# Patient Record
Sex: Female | Born: 1975
Health system: Southern US, Community
[De-identification: ages and names within clinical notes are randomized; demographics above are authoritative.]

## PROBLEM LIST (undated history)

## (undated) DIAGNOSIS — Z8489 Family history of other specified conditions: Secondary | ICD-10-CM

## (undated) DIAGNOSIS — Z8042 Family history of malignant neoplasm of prostate: Secondary | ICD-10-CM

## (undated) DIAGNOSIS — Z8041 Family history of malignant neoplasm of ovary: Secondary | ICD-10-CM

## (undated) HISTORY — DX: Family history of malignant neoplasm of prostate: Z80.42

## (undated) HISTORY — DX: Family history of other specified conditions: Z84.89

## (undated) HISTORY — DX: Family history of malignant neoplasm of ovary: Z80.41

## (undated) HISTORY — PX: LAPAROSCOPIC ABDOMINAL EXPLORATION: SHX6249

---

## 2011-02-13 ENCOUNTER — Ambulatory Visit: Payer: Worker's Compensation

## 2011-03-22 ENCOUNTER — Other Ambulatory Visit (HOSPITAL_COMMUNITY)
Admission: RE | Admit: 2011-03-22 | Discharge: 2011-03-22 | Disposition: A | Discharge status: Home / Self Care | Payer: BC Managed Care – PPO | Source: Ambulatory Visit | Attending: Obstetrics and Gynecology | Admitting: Obstetrics and Gynecology

## 2011-03-22 DIAGNOSIS — Z1159 Encounter for screening for other viral diseases: Secondary | ICD-10-CM | POA: Insufficient documentation

## 2011-03-22 DIAGNOSIS — Z124 Encounter for screening for malignant neoplasm of cervix: Secondary | ICD-10-CM | POA: Insufficient documentation

## 2013-06-08 ENCOUNTER — Other Ambulatory Visit: Payer: Self-pay | Admitting: Obstetrics and Gynecology

## 2013-06-08 ENCOUNTER — Other Ambulatory Visit (HOSPITAL_COMMUNITY)
Admission: RE | Admit: 2013-06-08 | Discharge: 2013-06-08 | Disposition: A | Discharge status: Home / Self Care | Payer: 59 | Source: Ambulatory Visit | Attending: Obstetrics and Gynecology | Admitting: Obstetrics and Gynecology

## 2013-06-08 DIAGNOSIS — Z1151 Encounter for screening for human papillomavirus (HPV): Secondary | ICD-10-CM | POA: Insufficient documentation

## 2013-06-08 DIAGNOSIS — Z01419 Encounter for gynecological examination (general) (routine) without abnormal findings: Secondary | ICD-10-CM | POA: Insufficient documentation

## 2015-10-09 DIAGNOSIS — G43019 Migraine without aura, intractable, without status migrainosus: Secondary | ICD-10-CM | POA: Diagnosis not present

## 2015-10-09 DIAGNOSIS — G43719 Chronic migraine without aura, intractable, without status migrainosus: Secondary | ICD-10-CM | POA: Diagnosis not present

## 2016-02-05 DIAGNOSIS — M545 Low back pain: Secondary | ICD-10-CM | POA: Diagnosis not present

## 2016-02-05 DIAGNOSIS — M2550 Pain in unspecified joint: Secondary | ICD-10-CM | POA: Diagnosis not present

## 2016-02-05 DIAGNOSIS — R5383 Other fatigue: Secondary | ICD-10-CM | POA: Diagnosis not present

## 2016-02-05 DIAGNOSIS — M791 Myalgia: Secondary | ICD-10-CM | POA: Diagnosis not present

## 2016-02-07 DIAGNOSIS — M545 Low back pain: Secondary | ICD-10-CM | POA: Diagnosis not present

## 2016-02-21 DIAGNOSIS — Z6822 Body mass index (BMI) 22.0-22.9, adult: Secondary | ICD-10-CM | POA: Diagnosis not present

## 2016-02-21 DIAGNOSIS — Z01419 Encounter for gynecological examination (general) (routine) without abnormal findings: Secondary | ICD-10-CM | POA: Diagnosis not present

## 2016-03-25 DIAGNOSIS — G43019 Migraine without aura, intractable, without status migrainosus: Secondary | ICD-10-CM | POA: Diagnosis not present

## 2016-03-25 DIAGNOSIS — G43719 Chronic migraine without aura, intractable, without status migrainosus: Secondary | ICD-10-CM | POA: Diagnosis not present

## 2016-06-10 DIAGNOSIS — J0100 Acute maxillary sinusitis, unspecified: Secondary | ICD-10-CM | POA: Diagnosis not present

## 2016-07-09 ENCOUNTER — Other Ambulatory Visit: Payer: Self-pay | Admitting: Obstetrics and Gynecology

## 2016-07-09 ENCOUNTER — Other Ambulatory Visit (HOSPITAL_COMMUNITY)
Admission: RE | Admit: 2016-07-09 | Discharge: 2016-07-09 | Disposition: A | Discharge status: Home / Self Care | Payer: BLUE CROSS/BLUE SHIELD | Source: Ambulatory Visit | Attending: Obstetrics and Gynecology | Admitting: Obstetrics and Gynecology

## 2016-07-09 DIAGNOSIS — Z01419 Encounter for gynecological examination (general) (routine) without abnormal findings: Secondary | ICD-10-CM | POA: Diagnosis not present

## 2016-07-09 DIAGNOSIS — Z975 Presence of (intrauterine) contraceptive device: Secondary | ICD-10-CM | POA: Diagnosis not present

## 2016-07-09 DIAGNOSIS — Z1151 Encounter for screening for human papillomavirus (HPV): Secondary | ICD-10-CM | POA: Diagnosis not present

## 2016-07-09 DIAGNOSIS — Z8742 Personal history of other diseases of the female genital tract: Secondary | ICD-10-CM | POA: Diagnosis not present

## 2016-07-11 DIAGNOSIS — Z8742 Personal history of other diseases of the female genital tract: Secondary | ICD-10-CM | POA: Diagnosis not present

## 2016-07-12 LAB — CYTOLOGY - PAP
Diagnosis: NEGATIVE
HPV: NOT DETECTED

## 2016-08-23 ENCOUNTER — Encounter: Payer: Self-pay | Admitting: Neurology

## 2016-10-14 DIAGNOSIS — L237 Allergic contact dermatitis due to plants, except food: Secondary | ICD-10-CM | POA: Diagnosis not present

## 2016-10-15 DIAGNOSIS — L255 Unspecified contact dermatitis due to plants, except food: Secondary | ICD-10-CM | POA: Diagnosis not present

## 2016-11-04 DIAGNOSIS — N750 Cyst of Bartholin's gland: Secondary | ICD-10-CM | POA: Diagnosis not present

## 2016-11-05 DIAGNOSIS — N907 Vulvar cyst: Secondary | ICD-10-CM | POA: Diagnosis not present

## 2016-12-03 ENCOUNTER — Ambulatory Visit
Admission: RE | Admit: 2016-12-03 | Discharge: 2016-12-03 | Disposition: A | Discharge status: Home / Self Care | Payer: BLUE CROSS/BLUE SHIELD | Source: Ambulatory Visit | Attending: Neurology | Admitting: Neurology

## 2016-12-03 ENCOUNTER — Other Ambulatory Visit: Payer: BLUE CROSS/BLUE SHIELD

## 2016-12-03 ENCOUNTER — Ambulatory Visit (INDEPENDENT_AMBULATORY_CARE_PROVIDER_SITE_OTHER): Payer: BLUE CROSS/BLUE SHIELD | Admitting: Neurology

## 2016-12-03 ENCOUNTER — Encounter: Payer: Self-pay | Admitting: Neurology

## 2016-12-03 VITALS — BP 90/68 | HR 108 | Ht 60.4 in | Wt 139.6 lb

## 2016-12-03 DIAGNOSIS — M25511 Pain in right shoulder: Secondary | ICD-10-CM | POA: Diagnosis not present

## 2016-12-03 DIAGNOSIS — M50223 Other cervical disc displacement at C6-C7 level: Secondary | ICD-10-CM | POA: Diagnosis not present

## 2016-12-03 DIAGNOSIS — R202 Paresthesia of skin: Secondary | ICD-10-CM | POA: Diagnosis not present

## 2016-12-03 DIAGNOSIS — G8929 Other chronic pain: Secondary | ICD-10-CM

## 2016-12-03 DIAGNOSIS — M542 Cervicalgia: Secondary | ICD-10-CM

## 2016-12-03 DIAGNOSIS — M2550 Pain in unspecified joint: Secondary | ICD-10-CM | POA: Diagnosis not present

## 2016-12-03 DIAGNOSIS — R200 Anesthesia of skin: Secondary | ICD-10-CM

## 2016-12-03 DIAGNOSIS — M25512 Pain in left shoulder: Secondary | ICD-10-CM

## 2016-12-03 NOTE — Progress Notes (Signed)
NEUROLOGY CONSULTATION NOTE  Albertina Leise MRN: [id number] DOB: 11-17-1975  Referring provider: Gerre Pebbles, PA Primary care provider: Gerre Pebbles, PA  Reason for consult:  Bilateral shoulder pain, arm numbness, back pain  HISTORY OF PRESENT ILLNESS: April Morgan is a 41 year old left handed female who presents for shoulder pain, numbness of upper extremities, back pain and generalized weakness  She has history of back and shoulder pain at least for 3 years.  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  She potentially lifts up to 20 lbs.  She feels that this repetitive activity over time has caused increased muscle and nerve pain.  She specifically notes aching/sharp pain in both shoulders.  She also notes numbness and tingling that radiates down both arms and into the hands.  She notices it when she wakes up in the morning, holds a phone or is typing on the computer.  Her arms also feel heavy.  She has difficulty lifting her arms above her head and perform activities such as blow drying her hair.  She has mild neck tightness but nothing severe.  She denies radicular pain down the arms.  She has seen orthopedics in the past.  Cortisone injections into the right shoulder briefly helped.   She also has left sided low back pain.  It is a sharp pain that does not radiate down the legs.  She denies numbness in the feet.  Legs may feel a little heavy.  Pain is aggravated by all movement.  She uses a TENS unit.  She has tried home exercises but never went to physical therapy.  She has had several imaging of the lumbar spine, including CT but never MRI.  Imaging not available, but report of complete 4+ view X-ray of lumbar spine from 02/07/16 notes "[n]o acute or chronic bony abnormality of the lumbar spine.  No significant disc space narrowing".   A couple of months ago, she started Lyrica  daily, which has been helpful.  She also takes  amitriptyline  daily for migraines.   Her mother has arthritis.    PAST MEDICAL HISTORY: History reviewed. No pertinent past medical history.  PAST SURGICAL HISTORY: Past Surgical History:  Procedure Laterality Date  . CESAREAN SECTION    . LAPAROSCOPIC ABDOMINAL EXPLORATION      MEDICATIONS: No current outpatient prescriptions on file prior to visit.   No current facility-administered medications on file prior to visit.     ALLERGIES: Allergies  Allergen Reactions  . Penicillins     FAMILY HISTORY: Family History  Problem Relation Age of Onset  . Prostate cancer Maternal Grandfather   . Prostate cancer Paternal Grandmother     SOCIAL HISTORY: Social History   Social History  . Marital status: Married    Spouse name: N/A  . Number of children: 3  . Years of education: 12   Occupational History  . shipping receiving    Social History Main Topics  . Smoking status: Never Smoker  . Smokeless tobacco: Never Used  . Alcohol use 0.6 oz/week    1 Glasses of wine per week  . Drug use: No  . Sexual activity: Not on file   Other Topics Concern  . Not on file   Social History Narrative   Married, lives in 1 story home, with husband and 3 children. They have 1 dog.    REVIEW OF SYSTEMS: Constitutional: No fevers, chills, or sweats, no generalized  fatigue, change in appetite Eyes: No visual changes, double vision, eye pain Ear, nose and throat: No hearing loss, ear pain, nasal congestion, sore throat Cardiovascular: No chest pain, palpitations Respiratory:  No shortness of breath at rest or with exertion, wheezes GastrointestinaI: No nausea, vomiting, diarrhea, abdominal pain, fecal incontinence Genitourinary:  No dysuria, urinary retention or frequency Musculoskeletal:  Bilateral shoulder pain, back pain Integumentary: No rash, pruritus, skin lesions Neurological: as above Psychiatric: No depression, insomnia, anxiety Endocrine: No palpitations,  fatigue, diaphoresis, mood swings, change in appetite, change in weight, increased thirst Hematologic/Lymphatic:  No purpura, petechiae. Allergic/Immunologic: no itchy/runny eyes, nasal congestion, recent allergic reactions, rashes  PHYSICAL EXAM: Vitals:   12/03/16 0819  BP: 90/68  Pulse: (!) 108  SpO2: 98%   General: No acute distress.  Patient appears well-groomed.  Head:  Normocephalic/atraumatic Eyes:  fundi examined but not visualized Neck: supple, no paraspinal tenderness, full range of motion Back: No paraspinal tenderness Heart: regular rate and rhythm Lungs: Clear to auscultation bilaterally. Vascular: No carotid bruits. Neurological Exam: Mental status: alert and oriented to person, place, and time, recent and remote memory intact, fund of knowledge intact, attention and concentration intact, speech fluent and not dysarthric, language intact. Cranial nerves: CN I: not tested CN II: pupils equal, round and reactive to light, visual fields intact CN III, IV, VI:  full range of motion, no nystagmus, no ptosis CN V: facial sensation intact CN VII: upper and lower face symmetric CN VIII: hearing intact CN IX, X: gag intact, uvula midline CN XI: sternocleidomastoid and trapezius muscles intact CN XII: tongue midline Bulk & Tone: normal, no fasciculations. Motor:  5/5 throughout  Sensation:  Pinprick and vibration sensation intact. Deep Tendon Reflexes:  2+ throughout, toes downgoing.  Finger to nose testing:  Without dysmetria.  Heel to shin:  Without dysmetria.  Gait:  Normal station and stride.  Able to turn and tandem walk. Romberg negative.  IMPRESSION: Bilateral upper extremity numbness Bilateral shoulder pain Low back pain  Neurologic exam is unremarkable.  Bilateral arm numbness may be carpal tunnel syndrome.  A cervical radiculopathy is possible but less likely given the bilateral symptoms.  She may have an underlying chronic pain syndrome.  PLAN: 1.  She  should continue Lyrica and titrate up as per PCP. 2.  We will check NCV-EMG of upper extremities 3.  We will check cervical spine X-rays 4.  Check ANA, Sed Rate, RF 5.  Further recommendations/follow up pending test results.  45 minutes spent face to face with patient, over 50% spent discussing diagnosis and workup.  Thank you for allowing me to take part in the care of this patient.  Shon Millet, DO  CC:  Gerre Pebbles, PA-C

## 2016-12-03 NOTE — Patient Instructions (Signed)
1.  We will check X-ray of cervical spine  2.  We will check a nerve study of the arms 3.  We will check ANA, Sed Rate, RF 4.  Further recommendations and follow up pending results.

## 2016-12-04 LAB — SEDIMENTATION RATE: Sed Rate: 2 mm/h (ref 0–20)

## 2016-12-04 LAB — ANA: Anti Nuclear Antibody(ANA): NEGATIVE

## 2016-12-04 LAB — RHEUMATOID FACTOR: Rhuematoid fact SerPl-aCnc: <14 IU/mL (ref <14)

## 2016-12-06 ENCOUNTER — Telehealth: Payer: Self-pay

## 2016-12-06 NOTE — Telephone Encounter (Signed)
LM for Pt advising of xray and lab results and that we will wait on EMG results for further testing if any

## 2016-12-06 NOTE — Telephone Encounter (Signed)
-----   Message from Drema Dallas, DO sent at 12/03/2016 10:15 AM EDT ----- X-ray shows some mild degenerative disc disease.  I want to see if the EMG shows anything and then decide if further testing is required.

## 2016-12-17 ENCOUNTER — Ambulatory Visit (INDEPENDENT_AMBULATORY_CARE_PROVIDER_SITE_OTHER): Payer: BLUE CROSS/BLUE SHIELD | Admitting: Neurology

## 2016-12-17 DIAGNOSIS — R200 Anesthesia of skin: Secondary | ICD-10-CM

## 2016-12-17 DIAGNOSIS — M25512 Pain in left shoulder: Secondary | ICD-10-CM

## 2016-12-17 DIAGNOSIS — M542 Cervicalgia: Secondary | ICD-10-CM

## 2016-12-17 DIAGNOSIS — M25511 Pain in right shoulder: Secondary | ICD-10-CM

## 2016-12-17 DIAGNOSIS — G8929 Other chronic pain: Secondary | ICD-10-CM

## 2016-12-17 NOTE — Procedures (Signed)
[facility]  [address] [place], [address]  [place], [place] [address] Tel: [phone] Fax:  [phone] Test Date:  12/17/2016  Patient: April Morgan DOB: 1975-08-07 Physician: Nita Sickle, DO  Sex: Female Height:  Ref Phys: Shon Millet, DO  ID#: [id number] Temp: 33.6C Technician:    Patient Complaints: This is a 41 year old female referred for evaluation of bilateral upper extremity numbness starting in the shoulder region radiating into the arm.  NCV & EMG Findings: Extensive electrodiagnostic testing of the right upper extremity and additional studies of the left shows:  1. Bilateral median, ulnar, and mixed palmar sensory responses are within normal limits. 2. Bilateral median and ulnar motor responses are within normal limits. 3. Chronic motor axon loss changes are isolated to the right first dorsal interosseous and triceps muscles, without accompanied active denervation.  Impression: 1. Chronic C8 radiculopathy affecting the right upper extremity, mild in degree electrically. 2. There is no evidence of carpal tunnel syndrome affecting the upper extremities.   ___________________________ Nita Sickle, DO    Nerve Conduction Studies Anti Sensory Summary Table   Site NR Peak (ms) Norm Peak (ms) P-T Amp (V) Norm P-T Amp  Left Median Anti Sensory (2nd Digit)  33.6C  Wrist    3.2 <3.4 54.4 >20  Right Median Anti Sensory (2nd Digit)  Wrist    3.1 <3.4 53.4 >20  Left Ulnar Anti Sensory (5th Digit)  33.6C  Wrist    3.0 <3.1 39.2 >12  Right Ulnar Anti Sensory (5th Digit)  33.6C  Wrist    3.0 <3.1 49.3 >12   Motor Summary Table   Site NR Onset (ms) Norm Onset (ms) O-P Amp (mV) Norm O-P Amp Site1 Site2 Delta-0 (ms) Dist (cm) Vel (m/s) Norm Vel (m/s)  Left Median Motor (Abd Poll Brev)  33.6C  Wrist    2.8 <3.9 9.4 >6 Elbow Wrist 4.9 27.0 55 >50  Elbow    7.7  8.9         Right Median Motor (Abd Poll Brev)  33.6C  Wrist    2.7 <3.9 10.4 >6 Elbow  Wrist 5.1 28.5 56 >50  Elbow    7.8  9.6         Left Ulnar Motor (Abd Dig Minimi)  33.6C  Wrist    2.7 <3.1 10.6 >7 B Elbow Wrist 3.6 23.0 64 >50  B Elbow    6.3  9.9  A Elbow B Elbow 1.7 10.0 59 >50  A Elbow    8.0  9.2         Right Ulnar Motor (Abd Dig Minimi)  33.6C  Wrist    2.3 <3.1 12.4 >7 B Elbow Wrist 3.8 23.0 61 >50  B Elbow    6.1  11.3  A Elbow B Elbow 1.9 10.0 53 >50  A Elbow    8.0  10.3          Comparison Summary Table   Site NR Peak (ms) Norm Peak (ms) P-T Amp (V) Site1 Site2 Delta-P (ms) Norm Delta (ms)  Left Median/Ulnar Palm Comparison (Wrist - 8cm)  33.6C  Median Palm    1.5 <2.2 67.3 Median Palm Ulnar Palm 0.3   Ulnar Palm    1.8 <2.2 41.6      Right Median/Ulnar Palm Comparison (Wrist - 8cm)  33.6C  Median Palm    1.8 <2.2 59.6 Median Palm Ulnar Palm 0.2   Ulnar Palm    1.6 <2.2 15.7  EMG   Side Muscle Ins Act Fibs Psw Fasc Number Recrt Dur Dur. Amp Amp. Poly Poly. Comment  Left 1stDorInt Nml Nml Nml Nml Nml Nml Nml Nml Nml Nml Nml Nml N/A  Left Ext Indicis Nml Nml Nml Nml Nml Nml Nml Nml Nml Nml Nml Nml N/A  Left PronatorTeres Nml Nml Nml Nml Nml Nml Nml Nml Nml Nml Nml Nml N/A  Left Deltoid Nml Nml Nml Nml Nml Nml Nml Nml Nml Nml Nml Nml N/A  Left Biceps Nml Nml Nml Nml Nml Nml Nml Nml Nml Nml Nml Nml N/A  Left Triceps Nml Nml Nml Nml Nml Nml Nml Nml Nml Nml Nml Nml N/A  Right Deltoid Nml Nml Nml Nml Nml Nml Nml Nml Nml Nml Nml Nml N/A  Right Ext Indicis Nml Nml Nml Nml Nml Nml Nml Nml Nml Nml Nml Nml N/A  Right PronatorTeres Nml Nml Nml Nml Nml Nml Nml Nml Nml Nml Nml Nml N/A  Right Biceps Nml Nml Nml Nml Nml Nml Nml Nml Nml Nml Nml Nml N/A  Right Triceps Nml Nml Nml Nml 1- Rapid Some 1+ Some 1+ Nml Nml N/A  Right 1stDorInt Nml Nml Nml Nml 1- Rapid Some 1+ Some 1+ Nml Nml N/A      Waveforms:

## 2016-12-18 ENCOUNTER — Telehealth: Payer: Self-pay

## 2016-12-18 DIAGNOSIS — R200 Anesthesia of skin: Secondary | ICD-10-CM

## 2016-12-18 DIAGNOSIS — R202 Paresthesia of skin: Principal | ICD-10-CM

## 2016-12-18 NOTE — Telephone Encounter (Signed)
-----   Message from Drema Dallas, DO sent at 12/18/2016 12:12 PM EDT ----- Nerve study does not show carpal tunnel syndrome but does show evidence of old irritation of a nerve in the neck.  For further evaluation of bilateral arm numbness, I would like to check MRI of cervical spine without contrast.

## 2016-12-18 NOTE — Telephone Encounter (Signed)
Called and advsd Pt of EMG results and of MRI recommendation. Pt agreed to MRI. Put in orders

## 2016-12-18 NOTE — Progress Notes (Signed)
Nerve test doesn't reveal any evidence of carpal tunnel syndrome.  It does show old findings of possible pinched nerve in the neck.  I would like to get MRI of cervical spine to evaluate further for cervical radiculopathy

## 2016-12-31 ENCOUNTER — Ambulatory Visit
Admission: RE | Admit: 2016-12-31 | Discharge: 2016-12-31 | Disposition: A | Discharge status: Home / Self Care | Payer: BLUE CROSS/BLUE SHIELD | Source: Ambulatory Visit | Attending: Neurology | Admitting: Neurology

## 2016-12-31 DIAGNOSIS — R202 Paresthesia of skin: Principal | ICD-10-CM

## 2016-12-31 DIAGNOSIS — M5023 Other cervical disc displacement, cervicothoracic region: Secondary | ICD-10-CM | POA: Diagnosis not present

## 2016-12-31 DIAGNOSIS — R200 Anesthesia of skin: Secondary | ICD-10-CM

## 2017-01-02 ENCOUNTER — Telehealth: Payer: Self-pay

## 2017-01-02 DIAGNOSIS — M62838 Other muscle spasm: Secondary | ICD-10-CM

## 2017-01-02 MED ORDER — CYCLOBENZAPRINE HCL 10 MG PO TABS: 10.0000 mg | ORAL_TABLET | Freq: Three times a day (TID) | ORAL | 0 refills | Status: DC | PRN

## 2017-01-02 NOTE — Telephone Encounter (Signed)
Called Pt. advsd her of referral to Dr Katrinka BlazingSmith and of Rx sent in. Advsd her of possible side effect

## 2017-01-02 NOTE — Telephone Encounter (Signed)
Called Pt, advsd her of wrist splint recommendations. Pt asked if could have a muscle relaxer for the tightness in across her shoulders and maybe see someone for that. She saw a Chiropractor before that didn't really help. OK to refer to Dr Terrilee FilesZach Smith?

## 2017-01-02 NOTE — Telephone Encounter (Signed)
-----   Message from Drema DallasAdam R Jaffe, DO sent at 01/01/2017  7:02 AM EDT ----- MRI of cervical spine does show a tiny disc bulge but nothing significant and unlikely cause of the numbness.  I would recommend trying wrist splints to wear at home (particularly at night in bed) to see if it helps with the bilateral hand numbness (this may help diagnosis a very mild carpal tunnel syndrome which may not be found on the nerve test).  Otherwise, I have no further recommendations at this time.  If the bilateral arm/hand numbness get worse, then we can always repeat nerve test in 6 to 12 months to look for any changes.

## 2017-01-02 NOTE — Telephone Encounter (Signed)
Ok to refer to Dr. Katrinka BlazingSmith We can prescribe her cyclobenzaprine 10mg  every 8 hours as needed for muscle spasms (no refills).  She should be cautious for drowsiness.

## 2017-01-25 NOTE — Progress Notes (Deleted)
April Morgan D.O.  Sports Medicine [address] [place], [place] [address] Phone: [phone] Subjective:    I'm seeing this patient by the request  of:  Dr. Cherylann RatelPatel DO, Jaffe DO  CC: Muscle spasms, neck pain  BJY:NWGNFAOZHYHPI:Subjective  April Ericka PontiffMontgomery is a 41 y.o. female coming in with complaint of neck pain.  Patient has had a history of this for approximately 3 years.  Feels that lifting at her work seems to be giving her some discomfort and muscle spasms.  Patient states that repetitive lifting seems to make it worse.  Patient has seen other providers for this and even had injections in the shoulder with very brief improvement.  Seems to be worse with movement.  Has used a TENS unit.  Patient was put on Lyrica by her primary care provider. Neurology did an EMG. EMG did show some chronic C8 radiculopathy affecting the right upper extremity that is mild  And had a MRI of the neck done.  This was independently visualized by me.  MRI showed patient did have a very small left paracentral protrusion at C7-T1 but otherwise unremarkable.  She states  No past medical history on file. Past Surgical History:  Procedure Laterality Date  . CESAREAN SECTION    . LAPAROSCOPIC ABDOMINAL EXPLORATION     Social History   Socioeconomic History  . Marital status: Married    Spouse name: Not on file  . Number of children: 3  . Years of education: 6012  . Highest education level: Not on file  Social Needs  . Financial resource strain: Not on file  . Food insecurity - worry: Not on file  . Food insecurity - inability: Not on file  . Transportation needs - medical: Not on file  . Transportation needs - non-medical: Not on file  Occupational History  . Occupation: shipping receiving  Tobacco Use  . Smoking status: Never Smoker  . Smokeless tobacco: Never Used  Substance and Sexual Activity  . Alcohol use: Yes    Alcohol/week: 0.6 oz    Types: 1 Glasses of wine per week  . Drug use: No  . Sexual  activity: Not on file  Other Topics Concern  . Not on file  Social History Narrative   Married, lives in 1 story home, with husband and 3 children. They have 1 dog.   Allergies  Allergen Reactions  . Penicillins    Family History  Problem Relation Age of Onset  . Prostate cancer Maternal Grandfather   . Prostate cancer Paternal Grandmother      Past medical history, social, surgical and family history all reviewed in electronic medical record.  No pertanent information unless stated regarding to the chief complaint.   Review of Systems:Review of systems updated and as accurate as of 01/25/17  No headache, visual changes, nausea, vomiting, diarrhea, constipation, dizziness, abdominal pain, skin rash, fevers, chills, night sweats, weight loss, swollen lymph nodes, body aches, joint swelling, muscle aches, chest pain, shortness of breath, mood changes.   Objective  There were no vitals taken for this visit. Systems examined below as of 01/25/17   General: No apparent distress alert and oriented x3 mood and affect normal, dressed appropriately.  HEENT: Pupils equal, extraocular movements intact  Respiratory: Patient's speak in full sentences and does not appear short of breath  Cardiovascular: No lower extremity edema, non tender, no erythema  Skin: Warm dry intact with no signs of infection or rash on extremities or on axial skeleton.  Abdomen: Soft nontender  Neuro: Cranial nerves II through XII are intact, neurovascularly intact in all extremities with 2+ DTRs and 2+ pulses.  Lymph: No lymphadenopathy of posterior or anterior cervical chain or axillae bilaterally.  Gait normal with good balance and coordination.  MSK:  Non tender with full range of motion and good stability and symmetric strength and tone of shoulders, elbows, wrist, hip, knee and ankles bilaterally.     Impression and Recommendations:     This case required medical decision making of moderate  complexity.      Note: This dictation was prepared with Dragon dictation along with smaller phrase technology. Any transcriptional errors that result from this process are unintentional.

## 2017-01-27 ENCOUNTER — Ambulatory Visit: Payer: BLUE CROSS/BLUE SHIELD | Admitting: Family Medicine

## 2017-02-10 NOTE — Progress Notes (Signed)
Tawana ScaleZach Sheneika Walstad D.O. [facility] [address] [place], [place] [address] Phone: [phone] Subjective:     CC: neck and shoulder pain   BJY:NWGNFAOZHYHPI:Subjective  April Morgan is a 41 y.o. female coming in with complaint of neck and left shoulder pain. She has been having pain for 6 years. Over the past year her pain increased. She performs repetitive movements at work with her shoulders. She works in Teaching laboratory technicianshipping and receiving. She complains of nerve and muscle pain. She also feels that she does not have strength.   She also has pain in her neck that radiates into her left arm that is intermittent. She does have numbness that goes into both hands.  Patient is concerned because even work she seems to be dropping things on a more regular basis.  Certain things such as even dressing has become more difficult.    Patient did undergo a EMG and a nerve conduction study December 17, 2016 showing a chronic C8 radiculopathy affecting the right upper extremity mild in degree severity.  Also had an MRI of the cervical spine done December 31, 2016 that was independently visualized by me.  Was found to have a very small left-sided paracentral protrusion at C7-T1 without any nerve impingement.  No past medical history on file. Past Surgical History:  Procedure Laterality Date  . CESAREAN SECTION    . LAPAROSCOPIC ABDOMINAL EXPLORATION     Social History   Socioeconomic History  . Marital status: Married    Spouse name: None  . Number of children: 3  . Years of education: 1912  . Highest education level: None  Social Needs  . Financial resource strain: None  . Food insecurity - worry: None  . Food insecurity - inability: None  . Transportation needs - medical: None  . Transportation needs - non-medical: None  Occupational History  . Occupation: shipping receiving  Tobacco Use  . Smoking status: Never Smoker  . Smokeless tobacco: Never Used  Substance and Sexual Activity  . Alcohol use: Yes      Alcohol/week: 0.6 oz    Types: 1 Glasses of wine per week  . Drug use: No  . Sexual activity: None  Other Topics Concern  . None  Social History Narrative   Married, lives in 1 story home, with husband and 3 children. They have 1 dog.   Allergies  Allergen Reactions  . Penicillins    Family History  Problem Relation Age of Onset  . Prostate cancer Maternal Grandfather   . Prostate cancer Paternal Grandmother      Past medical history, social, surgical and family history all reviewed in electronic medical record.  No pertanent information unless stated regarding to the chief complaint.   Review of Systems:Review of systems updated and as accurate as of 02/11/17  No headache, visual changes, nausea, vomiting, diarrhea, constipation, dizziness, abdominal pain, skin rash, fevers, chills, night sweats, weight loss, swollen lymph nodes, body aches, joint swelling, muscle aches, chest pain, shortness of breath, mood changes.   Objective  Blood pressure 108/86, pulse (!) 107, height 5\' 4"  (1.626 m), weight 143 lb (64.9 kg), SpO2 98 %. Systems examined below as of 02/11/17   General: No apparent distress alert and oriented x3 mood and affect normal, dressed appropriately.  HEENT: Pupils equal, extraocular movements intact  Respiratory: Patient's speak in full sentences and does not appear short of breath  Cardiovascular: No lower extremity edema, non tender, no erythema  Skin: Warm dry intact with  no signs of infection or rash on extremities or on axial skeleton.  Abdomen: Soft nontender  Neuro: Cranial nerves II through XII are intact, neurovascularly intact in all extremities with 2+ DTRs and 2+ pulses.  Lymph: No lymphadenopathy of posterior or anterior cervical chain or axillae bilaterally.  Gait normal with good balance and coordination.  MSK:  Non tender with full range of motion and good stability and symmetric strength and tone of shoulders, elbows, wrist, hip, knee and  ankles bilaterally.  Neck: Inspection mild loss of lordosis. No palpable stepoffs. Positive Spurling's maneuver laterally C8 distribution. Full neck range of motion Grip strength 4 out of 5 Strength shows weakness of the C8 distribution bilaterally Very mild numbness over the palmar aspect of the little finger bilaterally Negative Hoffman sign bilaterally Reflexes normal    Impression and Recommendations:     This case required medical decision making of moderate complexity.      Note: This dictation was prepared with Dragon dictation along with smaller phrase technology. Any transcriptional errors that result from this process are unintentional.

## 2017-02-11 ENCOUNTER — Encounter: Payer: Self-pay | Admitting: Family Medicine

## 2017-02-11 ENCOUNTER — Ambulatory Visit: Payer: BLUE CROSS/BLUE SHIELD | Admitting: Family Medicine

## 2017-02-11 VITALS — BP 108/86 | HR 107 | Ht 64.0 in | Wt 143.0 lb

## 2017-02-11 DIAGNOSIS — M5412 Radiculopathy, cervical region: Secondary | ICD-10-CM | POA: Diagnosis not present

## 2017-02-11 MED ORDER — GABAPENTIN 100 MG PO CAPS: 200.0000 mg | ORAL_CAPSULE | Freq: Every day | ORAL | 3 refills | Status: DC

## 2017-02-11 MED ORDER — VITAMIN D (ERGOCALCIFEROL) 1.25 MG (50000 UNIT) PO CAPS: 50000.0000 [IU] | ORAL_CAPSULE | ORAL | 0 refills | Status: DC

## 2017-02-11 NOTE — Patient Instructions (Addendum)
Good to see you  Ice 20 minutes 2 times daily. Usually after activity and before bed. Exercises 3 times a week.  Gabapentin 200mg  at night Once weekly vitamin D for 12 weeks  We will get an epidural in the neck and see how that does See me again 2-3 weeks after the epidural

## 2017-02-11 NOTE — Assessment & Plan Note (Signed)
Patient does have more of a C8 distribution with some weakness.  Patient is having more of a constant numbness as well.  Positive Spurling's.  Patient's MRI as well as EMG all correspond to the same area.  We discussed different treatment options and patient has elected to try the gabapentin at night as well as an epidural for diagnostic and hopefully therapeutic purposes.  Encourage patient to monitor her lifting mechanics.  Patient does not want any limitations at work at this moment.  Follow-up with me again in 2-3 weeks after the epidural to see how patient responds.

## 2017-02-25 ENCOUNTER — Other Ambulatory Visit: Payer: BLUE CROSS/BLUE SHIELD

## 2017-02-26 ENCOUNTER — Ambulatory Visit
Admission: RE | Admit: 2017-02-26 | Discharge: 2017-02-26 | Disposition: A | Discharge status: Home / Self Care | Payer: BLUE CROSS/BLUE SHIELD | Source: Ambulatory Visit | Attending: Family Medicine | Admitting: Family Medicine

## 2017-02-26 DIAGNOSIS — M5412 Radiculopathy, cervical region: Secondary | ICD-10-CM

## 2017-02-26 DIAGNOSIS — M5023 Other cervical disc displacement, cervicothoracic region: Secondary | ICD-10-CM | POA: Diagnosis not present

## 2017-02-26 MED ORDER — TRIAMCINOLONE ACETONIDE 40 MG/ML IJ SUSP (RADIOLOGY)
60.0000 mg | Freq: Once | INTRAMUSCULAR | Status: AC
  Administered 2017-02-26: 60 mg via EPIDURAL

## 2017-02-26 MED ORDER — IOPAMIDOL (ISOVUE-M 300) INJECTION 61%
1.0000 mL | Freq: Once | INTRAMUSCULAR | Status: AC | PRN
  Administered 2017-02-26: 1 mL via EPIDURAL

## 2017-02-26 NOTE — Discharge Instructions (Signed)

## 2017-03-25 ENCOUNTER — Other Ambulatory Visit: Payer: Self-pay | Admitting: Neurology

## 2017-03-25 ENCOUNTER — Ambulatory Visit: Payer: BLUE CROSS/BLUE SHIELD | Admitting: Family Medicine

## 2017-03-25 ENCOUNTER — Encounter: Payer: Self-pay | Admitting: Family Medicine

## 2017-03-25 VITALS — BP 102/80 | HR 105 | Ht 64.0 in | Wt 143.0 lb

## 2017-03-25 DIAGNOSIS — M5412 Radiculopathy, cervical region: Secondary | ICD-10-CM | POA: Diagnosis not present

## 2017-03-25 DIAGNOSIS — M999 Biomechanical lesion, unspecified: Secondary | ICD-10-CM | POA: Diagnosis not present

## 2017-03-25 NOTE — Assessment & Plan Note (Signed)
Decision today to treat with OMT was based on Physical Exam  After verbal consent patient was treated with HVLA, ME, FPR techniques in cervical, thoracic, lumbar and sacral areas  Patient tolerated the procedure well with improvement in symptoms  Patient given exercises, stretches and lifestyle modifications  See medications in patient instructions if given  Patient will follow up in 4 weeks 

## 2017-03-25 NOTE — Progress Notes (Signed)
Tawana ScaleZach Morgan D.O. [facility] [address] [place], [place] [address] Phone: [phone] Subjective:     CC: Neck pain follow-up  BJY:NWGNFAOZHYHPI:Subjective  April Ericka PontiffMontgomery is a 42 y.o. female coming in with complaint of pain.  Was found to have cervical radiculopathy.  MRI was fine with small protruding disc at C7-T1.  Given epidural.  Patient had this 2 weeks ago.  Was 100% better for 1 week.  Still about 60-70% better.  Not able to notice increasing strength recently.  Patient denies any numbness at this point.  Everything seems to be intermittent.  Taking gabapentin and Lyrica at night.     No past medical history on file. Past Surgical History:  Procedure Laterality Date  . CESAREAN SECTION    . LAPAROSCOPIC ABDOMINAL EXPLORATION     Social History   Socioeconomic History  . Marital status: Married    Spouse name: None  . Number of children: 3  . Years of education: 5212  . Highest education level: None  Social Needs  . Financial resource strain: None  . Food insecurity - worry: None  . Food insecurity - inability: None  . Transportation needs - medical: None  . Transportation needs - non-medical: None  Occupational History  . Occupation: shipping receiving  Tobacco Use  . Smoking status: Never Smoker  . Smokeless tobacco: Never Used  Substance and Sexual Activity  . Alcohol use: Yes    Alcohol/week: 0.6 oz    Types: 1 Glasses of wine per week  . Drug use: No  . Sexual activity: None  Other Topics Concern  . None  Social History Narrative   Married, lives in 1 story home, with husband and 3 children. They have 1 dog.   Allergies  Allergen Reactions  . Penicillins    Family History  Problem Relation Age of Onset  . Prostate cancer Maternal Grandfather   . Prostate cancer Paternal Grandmother      Past medical history, social, surgical and family history all reviewed in electronic medical record.  No pertanent information unless stated regarding to the  chief complaint.   Review of Systems:Review of systems updated and as accurate as of 03/25/17  No headache, visual changes, nausea, vomiting, diarrhea, constipation, dizziness, abdominal pain, skin rash, fevers, chills, night sweats, weight loss, swollen lymph nodes, body aches, joint swelling, chest pain, shortness of breath, mood changes.  Positive muscle aches  Objective  Blood pressure 102/80, pulse (!) 105, height 5\' 4"  (1.626 m), weight 143 lb (64.9 kg), SpO2 98 %. Systems examined below as of 03/25/17   General: No apparent distress alert and oriented x3 mood and affect normal, dressed appropriately.  HEENT: Pupils equal, extraocular movements intact  Respiratory: Patient's speak in full sentences and does not appear short of breath  Cardiovascular: No lower extremity edema, non tender, no erythema  Skin: Warm dry intact with no signs of infection or rash on extremities or on axial skeleton.  Abdomen: Soft nontender  Neuro: Cranial nerves II through XII are intact, neurovascularly intact in all extremities with 2+ DTRs and 2+ pulses.  Lymph: No lymphadenopathy of posterior or anterior cervical chain or axillae bilaterally.  Gait normal with good balance and coordination.  MSK:  Non tender with full range of motion and good stability and symmetric strength and tone of shoulders, elbows, wrist, hip, knee and ankles bilaterally.  Neck neck: Inspection mild loss of lordosis. No palpable stepoffs. Negative Spurling's maneuver. Of sidebending bilaterally Grip strength  and sensation normal in bilateral hands Strength good C4 to T1 distribution No sensory change to C4 to T1 Negative Hoffman sign bilaterally Reflexes normal   Osteopathic findings C2 flexed rotated and side bent right C4 flexed rotated and side bent left C7 flexed rotated and side bent left T9 extended rotated and side bent left L3 flexed rotated and side bent right Sacrum right on right     Impression and  Recommendations:     This case required medical decision making of moderate complexity.      Note: This dictation was prepared with Dragon dictation along with smaller phrase technology. Any transcriptional errors that result from this process are unintentional.

## 2017-03-25 NOTE — Progress Notes (Signed)
Tawana ScaleZach Smith D.O. [facility] [address] [place], [place] [address] Phone: [phone](336) [phone] Subjective:    I'm seeing this patient by the request  of:    CC:   BJY:NWGNFAOZHYHPI:Subjective  April Morgan is a 42 y.o. female coming in for follow up for epidural injection. She has had some relief but is still having some pain. Her pain is present with activities that engage her shoulders overhead. She has been lifting repetitively at work.   Onset-  Location Duration-  Character- Aggravating factors- Reliving factors-  Therapies tried-  Severity-     No past medical history on file. Past Surgical History:  Procedure Laterality Date  . CESAREAN SECTION    . LAPAROSCOPIC ABDOMINAL EXPLORATION     Social History   Socioeconomic History  . Marital status: Married    Spouse name: Not on file  . Number of children: 3  . Years of education: 4812  . Highest education level: Not on file  Social Needs  . Financial resource strain: Not on file  . Food insecurity - worry: Not on file  . Food insecurity - inability: Not on file  . Transportation needs - medical: Not on file  . Transportation needs - non-medical: Not on file  Occupational History  . Occupation: shipping receiving  Tobacco Use  . Smoking status: Never Smoker  . Smokeless tobacco: Never Used  Substance and Sexual Activity  . Alcohol use: Yes    Alcohol/week: 0.6 oz    Types: 1 Glasses of wine per week  . Drug use: No  . Sexual activity: Not on file  Other Topics Concern  . Not on file  Social History Narrative   Married, lives in 1 story home, with husband and 3 children. They have 1 dog.   Allergies  Allergen Reactions  . Penicillins    Family History  Problem Relation Age of Onset  . Prostate cancer Maternal Grandfather   . Prostate cancer Paternal Grandmother      Past medical history, social, surgical and family history all reviewed in electronic medical record.  No pertanent information unless  stated regarding to the chief complaint.   Review of Systems:Review of systems updated and as accurate as of 03/25/17  No headache, visual changes, nausea, vomiting, diarrhea, constipation, dizziness, abdominal pain, skin rash, fevers, chills, night sweats, weight loss, swollen lymph nodes, body aches, joint swelling, muscle aches, chest pain, shortness of breath, mood changes.   Objective  There were no vitals taken for this visit. Systems examined below as of 03/25/17   General: No apparent distress alert and oriented x3 mood and affect normal, dressed appropriately.  HEENT: Pupils equal, extraocular movements intact  Respiratory: Patient's speak in full sentences and does not appear short of breath  Cardiovascular: No lower extremity edema, non tender, no erythema  Skin: Warm dry intact with no signs of infection or rash on extremities or on axial skeleton.  Abdomen: Soft nontender  Neuro: Cranial nerves II through XII are intact, neurovascularly intact in all extremities with 2+ DTRs and 2+ pulses.  Lymph: No lymphadenopathy of posterior or anterior cervical chain or axillae bilaterally.  Gait normal with good balance and coordination.  MSK:  Non tender with full range of motion and good stability and symmetric strength and tone of shoulders, elbows, wrist, hip, knee and ankles bilaterally.     Impression and Recommendations:     This case required medical decision making of moderate complexity.  Note: This dictation was prepared with Dragon dictation along with smaller phrase technology. Any transcriptional errors that result from this process are unintentional.

## 2017-03-25 NOTE — Patient Instructions (Signed)
Good to see you  We will order one more injection  Only gabapentin at night  Stop the lyrica See me again in 4-6 weeks for manipulation

## 2017-03-25 NOTE — Assessment & Plan Note (Signed)
Significant improvement after the epidural.  Started osteopathic manipulation.  Encouraged home exercises.  Discontinue the Lyrica.  We discussed the possibility of Effexor or possibly repeating the injection.  Patient will consider these.  Follow-up again in 4-6 weeks

## 2017-04-10 DIAGNOSIS — G43019 Migraine without aura, intractable, without status migrainosus: Secondary | ICD-10-CM | POA: Diagnosis not present

## 2017-04-10 DIAGNOSIS — G43719 Chronic migraine without aura, intractable, without status migrainosus: Secondary | ICD-10-CM | POA: Diagnosis not present

## 2017-04-25 ENCOUNTER — Ambulatory Visit
Admission: RE | Admit: 2017-04-25 | Discharge: 2017-04-25 | Disposition: A | Discharge status: Home / Self Care | Payer: BLUE CROSS/BLUE SHIELD | Source: Ambulatory Visit | Attending: Family Medicine | Admitting: Family Medicine

## 2017-04-25 DIAGNOSIS — M5412 Radiculopathy, cervical region: Secondary | ICD-10-CM | POA: Diagnosis not present

## 2017-04-25 MED ORDER — IOPAMIDOL (ISOVUE-M 300) INJECTION 61%
1.0000 mL | Freq: Once | INTRAMUSCULAR | Status: AC | PRN
  Administered 2017-04-25: 1 mL via EPIDURAL

## 2017-04-25 MED ORDER — TRIAMCINOLONE ACETONIDE 40 MG/ML IJ SUSP (RADIOLOGY)
60.0000 mg | Freq: Once | INTRAMUSCULAR | Status: AC
  Administered 2017-04-25: 60 mg via EPIDURAL

## 2017-05-01 ENCOUNTER — Other Ambulatory Visit: Payer: Self-pay | Admitting: Family Medicine

## 2017-06-12 DIAGNOSIS — Z23 Encounter for immunization: Secondary | ICD-10-CM | POA: Diagnosis not present

## 2017-07-16 ENCOUNTER — Other Ambulatory Visit: Payer: Self-pay | Admitting: Family Medicine

## 2017-07-16 NOTE — Telephone Encounter (Signed)
Refill done.  

## 2017-08-01 ENCOUNTER — Other Ambulatory Visit: Payer: Self-pay | Admitting: Neurology

## 2017-08-14 DIAGNOSIS — Z01411 Encounter for gynecological examination (general) (routine) with abnormal findings: Secondary | ICD-10-CM | POA: Diagnosis not present

## 2017-08-20 ENCOUNTER — Encounter: Payer: Self-pay | Admitting: Genetics

## 2017-08-20 ENCOUNTER — Telehealth: Payer: Self-pay | Admitting: Genetics

## 2017-08-20 NOTE — Telephone Encounter (Signed)
A genetic counseling appt has been scheduled for the pt to see Darral DashLindsay Smith on 7/22 at 4pm. Pt aware to arrive 30 minutes early. Letter mailed.

## 2017-08-27 DIAGNOSIS — N83202 Unspecified ovarian cyst, left side: Secondary | ICD-10-CM | POA: Diagnosis not present

## 2017-09-22 DIAGNOSIS — G43019 Migraine without aura, intractable, without status migrainosus: Secondary | ICD-10-CM | POA: Diagnosis not present

## 2017-09-22 DIAGNOSIS — G43719 Chronic migraine without aura, intractable, without status migrainosus: Secondary | ICD-10-CM | POA: Diagnosis not present

## 2017-09-29 ENCOUNTER — Inpatient Hospital Stay: Payer: BLUE CROSS/BLUE SHIELD | Attending: Genetic Counselor | Admitting: Genetics

## 2017-09-29 DIAGNOSIS — Z8042 Family history of malignant neoplasm of prostate: Secondary | ICD-10-CM

## 2017-09-29 DIAGNOSIS — Z8041 Family history of malignant neoplasm of ovary: Secondary | ICD-10-CM | POA: Diagnosis not present

## 2017-09-29 DIAGNOSIS — Z1379 Encounter for other screening for genetic and chromosomal anomalies: Secondary | ICD-10-CM

## 2017-09-29 DIAGNOSIS — Z8489 Family history of other specified conditions: Secondary | ICD-10-CM

## 2017-09-29 DIAGNOSIS — Z8481 Family history of carrier of genetic disease: Secondary | ICD-10-CM | POA: Diagnosis not present

## 2017-09-30 ENCOUNTER — Encounter: Payer: Self-pay | Admitting: Genetics

## 2017-09-30 DIAGNOSIS — Z8041 Family history of malignant neoplasm of ovary: Secondary | ICD-10-CM | POA: Insufficient documentation

## 2017-09-30 DIAGNOSIS — Z8489 Family history of other specified conditions: Secondary | ICD-10-CM | POA: Insufficient documentation

## 2017-09-30 DIAGNOSIS — Z8042 Family history of malignant neoplasm of prostate: Secondary | ICD-10-CM | POA: Insufficient documentation

## 2017-09-30 NOTE — Progress Notes (Signed)
REFERRING PROVIDER: Thurnell Lose, MD [address] [place], [place] [address]  PRIMARY PROVIDER:  Adron Bene, PA-C  PRIMARY REASON FOR VISIT:  1. Family history of prostate cancer   2. Family history of ovarian cancer   3. Family history of genetic disease     HISTORY OF PRESENT ILLNESS:   [name], a 42 y.o. female, was seen for a [facility] cancer genetics consultation at the request of Dr. Simona Huh due to a family history of cancer.  April Morgan presents to clinic today to discuss the possibility of a hereditary predisposition to cancer, genetic testing, and to further clarify her future cancer risks, as well as potential cancer risks for family members.   April Morgan is a 42 y.o. female with no personal history of cancer.     HORMONAL RISK FACTORS:  Menarche was at age 39.  First live birth at age 48.  OCP use for approximately 2 years.  Ovaries intact: yes.  Hysterectomy: no.  Menopausal status: premenopausal.  HRT use: 0 years. Colonoscopy: no; not examined. Mammogram within the last year: no. Has missed 1 year Number of breast biopsies: 0.   Past Medical History:  Diagnosis Date  . Family history of genetic disease   . Family history of ovarian cancer   . Family history of prostate cancer     Past Surgical History:  Procedure Laterality Date  . CESAREAN SECTION    . LAPAROSCOPIC ABDOMINAL EXPLORATION      Social History   Socioeconomic History  . Marital status: Married    Spouse name: Not on file  . Number of children: 3  . Years of education: 52  . Highest education level: Not on file  Occupational History  . Occupation: shipping receiving  Social Needs  . Financial resource strain: Not on file  . Food insecurity:    Worry: Not on file    Inability: Not on file  . Transportation needs:    Medical: Not on file    Non-medical: Not on file  Tobacco Use  . Smoking status: Never Smoker  . Smokeless tobacco: Never Used   Substance and Sexual Activity  . Alcohol use: Yes    Alcohol/week: 0.6 oz    Types: 1 Glasses of wine per week  . Drug use: No  . Sexual activity: Not on file  Lifestyle  . Physical activity:    Days per week: Not on file    Minutes per session: Not on file  . Stress: Not on file  Relationships  . Social connections:    Talks on phone: Not on file    Gets together: Not on file    Attends religious service: Not on file    Active member of club or organization: Not on file    Attends meetings of clubs or organizations: Not on file    Relationship status: Not on file  Other Topics Concern  . Not on file  Social History Narrative   Married, lives in 1 story home, with husband and 3 children. They have 1 dog.     FAMILY HISTORY:  We obtained a detailed, 4-generation family history.  Significant diagnoses are listed below: Family History  Problem Relation Age of Onset  . Prostate cancer Maternal Grandfather        metastatic, 80's  . Prostate cancer Paternal Grandmother        metastatic, 14's  . Ovarian cancer Paternal Aunt 95  . Skin cancer Paternal  Aunt   . Other Cousin        Positive genetic mutation- unk which mutation? report not available   April Morgan has 2 sons and 1 daughter ages 23, 55, and 70 with no history of cancer. April Morgan has 4 sisters and 1 brother.    April Morgan's father: 55, no history of cancer. He has declined genetic testing.  Paternal Aunts/Uncles: 2 paternal aunts and 1 paternal uncle.  1 paternal aunt was dx with ovarian cancer at 31 and died in her 43's.  She also had skin cancer.  Paternal cousins: no history of cancer.  The daughter of the aunt with ovarian cancer had genetic testing that revealed a genetic mutation.  April Morgan does not know the mutation and has not been able to get her cousin to resend her this information. She remembers it being assocaited with colon, breast, and skin cancer?? Paternal grandfather: died of  metastatic prostate cancer in his 40's Paternal grandmother:no history of cancer.  Her siblings/relatives had some history of breast cancer, exact dx and relations unk.   [name]'s mother: 19, no history of cancer.  Maternal Aunts/Uncles: several half maternal aunts/uncles, no info known  Maternal cousins: no info Maternal grandfather: died of metastatic prostate cancer in his 53's Maternal grandmother:no history of cancer.   Patient's maternal ancestors are of [place] descent, and paternal ancestors are of Scottish/Irish/Native American descent. There is no reported Ashkenazi Jewish ancestry. There is no known consanguinity.  GENETIC COUNSELING ASSESSMENT: [name] is a 42 y.o. female with a family history of a genetic mutation associated with increased cancer risk. We, therefore, discussed and recommended the following at today's visit.   DISCUSSION: We reviewed the characteristics, features and inheritance patterns of hereditary cancer syndromes. We also discussed genetic testing, including the appropriate family members to test, the process of testing, insurance coverage and turn-around-time for results. We discussed the implications of a negative, positive and/or variant of uncertain significant result. Because the report and name of the gene her cousin tested positive for is unavailable , we recommended a large panel to ensure we test whatever gene mutation was identified in her cousin.  We recommended April Morgan pursue genetic testing for the Multi-Cancer gene panel.   The Multi-Cancer Panel offered by [facility] includes sequencing and/or deletion duplication testing of the following 84 genes: AIP, ALK, APC, ATM, AXIN2,BAP1,  BARD1, BLM, BMPR1A, BRCA1, BRCA2, BRIP1, CASR, CDC73, CDH1, CDK4, CDKN1B, CDKN1C, CDKN2A (p14ARF), CDKN2A (p16INK4a), CEBPA, CHEK2, CTNNA1, DICER1, DIS3L2, EGFR (c.2369C>T, p.Thr790Met variant only), EPCAM (Deletion/duplication testing only), FH, FLCN, GATA2,  GPC3, GREM1 (Promoter region deletion/duplication testing only), HOXB13 (c.251G>A, p.Gly84Glu), HRAS, KIT, MAX, MEN1, MET, MITF (c.952G>A, p.Glu318Lys variant only), MLH1, MSH2, MSH3, MSH6, MUTYH, NBN, NF1, NF2, NTHL1, PALB2, PDGFRA, PHOX2B, PMS2, POLD1, POLE, POT1, PRKAR1A, PTCH1, PTEN, RAD50, RAD51C, RAD51D, RB1, RECQL4, RET, RUNX1, SDHAF2, SDHA (sequence changes only), SDHB, SDHC, SDHD, SMAD4, SMARCA4, SMARCB1, SMARCE1, STK11, SUFU, TERC, TERT, TMEM127, TP53, TSC1, TSC2, VHL, WRN and WT1.   We discussed that only 5-10% of cancers are associated with a Hereditary Cancer Predisposition Syndrome.  The most common hereditary cancer syndrome associated with colon cancer is Lynch Syndrome.  Given that she said the highest risk for the family mutation she recalls was colon cancer we discussed this condition.    Lynch Syndrome is caused by mutations in the genes: MLH1, MSH2, MSH6, PMS2 and EPCAM.  This syndrome increases the risk for colon, uterine, ovarian and stomach cancers, as well as  others.  Families with Lynch Syndrome tend to have multiple family members with these cancers, typically diagnosed under age 44, and diagnoses in multiple generations.    We discussed that there are several other genes that are associated with an increased risk for colon cancer and increased polyp burden (MUTYH, APC, POLE, CHEK2, etc.) We also dicussed that there are many genes that cause many different types of cancer risks.    We discussed that if she is found to have a mutation in one of these genes, it may impact future medical management recommendations such as increased cancer screenings and consideration of risk reducing surgeries.  A positive result could also have implications for the patient's family members.  A Negative result would likely mean we she did not inherit the familial mutation.  However, we cannot confirm this without a report from her cousin to confirm.  However, given we are ordering a large panel, it  can be reasonably assumed if her result is negative, it is unlikely she has the family mutation.   However, we did discuss genetic testing is not perfect and cannot definitively rule out a hereditary predisposition to cancer.  There could be mutations that are undetectable by current technology, or in genes not yet tested or identified to increase cancer risk.    We discussed the potential to find a Variant of Uncertain Significance or VUS.  These are variants that have not yet been identified as pathogenic or benign, and it is unknown if this variant is associated with increased cancer risk or if this is a normal finding.  Most VUS's are reclassified to benign or likely benign.   It should not be used to make medical management decisions. With time, we suspect the lab will determine the significance of any VUS's identified if any.   Based on Ms. Bulson's family history of cancer, she meets medical criteria for genetic testing. Despite that she meets criteria, she may still have an out of pocket cost. The laboratory can provide her with an estimate of her OOP cost.   PLAN: After considering the risks, benefits, and limitations, Ms. Kuper  provided informed consent to pursue genetic testing and the blood sample was sent to [facility] for analysis of the Multi-Cancer panel. Results should be available within approximately 2-3 weeks' time, at which point they will be disclosed by telephone to Ms. Kernes, as will any additional recommendations warranted by these results. Ms. Nath will receive a summary of her genetic counseling visit and a copy of her results once available. This information will also be available in Epic. We encouraged Ms. Kauer to remain in contact with cancer genetics annually so that we can continuously update the family history and inform her of any changes in cancer genetics and testing that may be of benefit for her family. Ms. Mozley's questions were  answered to her satisfaction today. Our contact information was provided should additional questions or concerns arise.  Based on Ms. Albarracin's family history, we recommended her maternal and paternal relatives also have genetic counseling and testing. Ms. Ricardo will let us know if we can be of any assistance in coordinating genetic counseling and/or testing for this family member.   Lastly, we encouraged Ms. Heidecker to remain in contact with cancer genetics annually so that we can continuously update the family history and inform her of any changes in cancer genetics and testing that may be of benefit for this family.   Ms.  Hinton's questions were answered  to her satisfaction today. Our contact information was provided should additional questions or concerns arise. Thank you for the referral and allowing [place] to share in the care of your patient.   Tana Felts, MS, [facility] Genetic Counselor [email or web address] phone: [phone]  The patient was seen for a total of 35 minutes in face-to-face genetic counseling. This patient was discussed with Dr.'s Magrinat, Dr. Lindi Adie, or Dr. Burr Medico who agrees with the above.

## 2017-10-13 ENCOUNTER — Telehealth: Payer: Self-pay | Admitting: Genetics

## 2017-10-15 ENCOUNTER — Encounter: Payer: Self-pay | Admitting: Genetics

## 2017-10-15 ENCOUNTER — Ambulatory Visit: Payer: Self-pay | Admitting: Genetics

## 2017-10-15 DIAGNOSIS — Z8041 Family history of malignant neoplasm of ovary: Secondary | ICD-10-CM

## 2017-10-15 DIAGNOSIS — Z8489 Family history of other specified conditions: Secondary | ICD-10-CM

## 2017-10-15 DIAGNOSIS — Z1379 Encounter for other screening for genetic and chromosomal anomalies: Secondary | ICD-10-CM

## 2017-10-15 DIAGNOSIS — Z8042 Family history of malignant neoplasm of prostate: Secondary | ICD-10-CM

## 2017-10-15 NOTE — Telephone Encounter (Addendum)
Revealed negative genetic testing.  Revealed VUS in TERT.  This normal result is reassuring and indicates that it is unlikely [name]'s cancer is due to a hereditary cause.  It is unlikely that there is an increased risk of another cancer due to a mutation in one of these genes.  However, genetic testing is not perfect, and cannot definitively rule out a hereditary cause.  It will be important for her to keep in contact with genetics to learn if any additional testing may be needed in the future.     [name] likely does not carry whatever family mutation was identified (although we cannot definitively confirm this without knowing what mutation was found I her paternal cousin). We recommend relatives on both sides of the family have genetic testing.   If she does learn what her paternal cousin's mutation was/what gene it was in we are happy to check her testing to make sure it was included

## 2017-10-15 NOTE — Progress Notes (Signed)
HPI:  Ms. Payton was previously seen in the [facility] on 09/29/2017 due to a family history of a genetic mutation (name of gene/mutation unk) and concerns regarding a hereditary predisposition to cancer. Please refer to our prior cancer genetics clinic note for more information regarding Ms. Kyser's medical, social and family histories, and our assessment and recommendations, at the time. Ms. Mahabir's recent genetic test results were disclosed to her, as well as recommendations warranted by these results. These results and recommendations are discussed in more detail below.  CANCER HISTORY:   No history exists.     FAMILY HISTORY:  We obtained a detailed, 4-generation family history.  Significant diagnoses are listed below: Family History  Problem Relation Age of Onset  . Prostate cancer Maternal Grandfather        metastatic, 80's  . Prostate cancer Paternal Grandmother        metastatic, 45's  . Ovarian cancer Paternal Aunt 67  . Skin cancer Paternal Aunt   . Other Cousin        Positive genetic mutation- unk which mutation? report not available    Ms. Yogi has 2 sons and 1 daughter ages 60, 61, and 61 with no history of cancer. Ms. Bawa has 4 sisters and 1 brother.    Ms. Bossard's father: 76, no history of cancer. He has declined genetic testing.  Paternal Aunts/Uncles: 2 paternal aunts and 1 paternal uncle.  1 paternal aunt was dx with ovarian cancer at 37 and died in her 59's.  She also had skin cancer.  Paternal cousins: no history of cancer.  The daughter of the aunt with ovarian cancer had genetic testing that revealed a genetic mutation.  Ms. Kilts does not know the mutation and has not been able to get her cousin to resend her this information. She remembers it being assocaited with colon, breast, and skin cancer?? Paternal grandfather: died of metastatic prostate cancer in his 34's Paternal grandmother:no history of  cancer.  Her siblings/relatives had some history of breast cancer, exact dx and relations unk.   Ms. Rasnic's mother: 34, no history of cancer.  Maternal Aunts/Uncles: several half maternal aunts/uncles, no info known  Maternal cousins: no info Maternal grandfather: died of metastatic prostate cancer in his 54's Maternal grandmother:no history of cancer.   Patient's maternal ancestors are of [place] descent, and paternal ancestors are of Scottish/Irish/Native American descent. There is no reported Ashkenazi Jewish ancestry. There is no known consanguinity.  GENETIC TEST RESULTS: Genetic testing performed through [facility]'s Multi-Cancer Panel reported out on 10/07/2017 showed no pathogenic mutations. The Multi-Cancer Panel offered by [facility] includes sequencing and/or deletion duplication testing of the following 84 genes: AIP,ALK, APC, ATM, AXIN2,BAP1,  BARD1, BLM, BMPR1A, BRCA1, BRCA2, BRIP1, CASR, CDC73, CDH1, CDK4, CDKN1B, CDKN1C, CDKN2A (p14ARF), CDKN2A (p16INK4a), CEBPA, CHEK2, CTNNA1, DICER1, DIS3L2, EGFR (c.2369C>T, p.Thr790Met variant only), EPCAM (Deletion/duplication testing only), FH, FLCN, GATA2, GPC3, GREM1 (Promoter region deletion/duplication testing only), HOXB13 (c.251G>A, p.Gly84Glu), HRAS, KIT, MAX, MEN1, MET, MITF (c.952G>A, p.Glu318Lys variant only), MLH1, MSH2, MSH3, MSH6, MUTYH, NBN, NF1, NF2, NTHL1, PALB2, PDGFRA, PHOX2B, PMS2, POLD1, POLE, POT1, PRKAR1A, PTCH1, PTEN, RAD50, RAD51C, RAD51D, RB1, RECQL4, RET, RUNX1, SDHAF2, SDHA (sequence changes only), SDHB, SDHC, SDHD, SMAD4, SMARCA4, SMARCB1, SMARCE1, STK11, SUFU, TERC, TERT, TMEM127, TP53, TSC1, TSC2, VHL, WRN and WT1.   A variant of uncertain significance (VUS) in a gene called TERT was also noted. c.2744G>A (p.Gly915Asp).   The test report will be scanned into EP[employer] and will  be located under the Molecular Pathology section of the Results Review tab. A portion of the result report is included below for reference.     We  discussed with Ms. Mills that because current genetic testing is not perfect, it is possible there may be a gene mutation in one of these genes that current testing cannot detect, but that chance is small.  We also discussed, that there could be another gene that has not yet been discovered, or that we have not yet tested, that is responsible for the cancer diagnoses in the family. It is also possible there is a hereditary cause for the cancer in the family that Ms. Klingerman did not inherit and therefore was not identified in her testing.  Therefore, it is important to remain in touch with cancer genetics in the future so that we can continue to offer Ms. Kleier the most up to date genetic testing.   Regarding the VUS in TERT: At this time, it is unknown if this variant is associated with increased cancer risk or if this is a normal finding, but most variants such as this get reclassified to being inconsequential. It should not be used to make medical management decisions. With time, we suspect the lab will determine the significance of this variant, if any. If we do learn more about it, we will try to contact Ms. Merida to discuss it further. However, it is important to stay in touch with [place] periodically and keep the address and phone number up to date.  ADDITIONAL GENETIC TESTING: We discussed with Ms. Cipriani that her genetic testing was fairly extensive.  If there are are genes identified to increase cancer risk that can be analyzed in the future, we would be happy to discuss and coordinate this testing at that time.    CANCER SCREENING RECOMMENDATIONS: Ms. Taff's test result is considered negative (normal).  This means that we have not identified a hereditary predisposition to cancer in her at this time.  It is unlikely Ms. Romas has an increased risk of cancer due to a mutation in one of these genes.    This result also means it is unlikely she carries whatever familial  mutation has been reported.  However, without knowing what gene and what mutation was identified in her relative, we cannot confirm this.  However, she was tested for a large panel that was very likely to analyze whichever 'cancer mutation' was found in the family.   While reassuring, this does not definitively rule out a hereditary predisposition to cancer. It is still possible that there could be genetic mutations that are undetectable by current technology, or genetic mutations in genes that have not been tested or identified to increase cancer risk.  Therefore, it is recommended she continue to follow the cancer management and screening guidelines provided by her oncology and primary healthcare provider. An individual's cancer risk is not determined by genetic test results alone.  Overall cancer risk assessment includes additional factors such as personal medical history, family history, etc.  These should be used to make a personalized plan for cancer prevention and surveillance.    RECOMMENDATIONS FOR FAMILY MEMBERS:  Relatives in this family might be at some increased risk of developing cancer, over the general population risk, simply due to the family history of cancer.  We recommended women in this family have a yearly mammogram beginning at age 42, or 21 years younger than the earliest onset of cancer, an annual clinical breast exam, and  perform monthly breast self-exams. Women in this family should also have a gynecological exam as recommended by their primary provider. All family members should have a colonoscopy by age 95 (or as directed by their doctors).  All family members should inform their physicians about the family history of cancer so their doctors can make the most appropriate screening recommendations for them.   It is also possible there is a hereditary cause for the cancer in Ms. Hildenbrand's family that she did not inherit and therefore was not identified in her.    Ms.  Labarre's paternal relatives should have testing for the familial mutation identified.  Ms. Freeman's maternal relatives may also consider genetic testing due to the family history of metastatic prostate cancer.   FOLLOW-UP: Lastly, we discussed with Ms. Burridge that cancer genetics is a rapidly advancing field and it is possible that new genetic tests will be appropriate for her and/or her family members in the future. We encouraged her to remain in contact with cancer genetics on an annual basis so we can update her personal and family histories and let her know of advances in cancer genetics that may benefit this family.   Our contact number was provided. Ms. Farha's questions were answered to her satisfaction, and she knows she is welcome to call us at anytime with additional questions or concerns.   Ferol Luz, MS, [facility] Certified Genetic Counselor [email or web address]

## 2018-01-29 DIAGNOSIS — D2239 Melanocytic nevi of other parts of face: Secondary | ICD-10-CM | POA: Diagnosis not present

## 2018-01-29 DIAGNOSIS — C44212 Basal cell carcinoma of skin of right ear and external auricular canal: Secondary | ICD-10-CM | POA: Diagnosis not present

## 2018-03-19 IMAGING — XA DG INJECT/[PERSON_NAME] INC NEEDLE/CATH/PLC EPI/CERV/THOR W/IMG
3 series · 3 of 3 positions shown · non-contrast
Comparison: none

CLINICAL DATA: Cervical spondylosis without myelopathy. Significant
improvement after the first injection. I repeated it.

[Series 1: ortho adipose · 1 of 1 slices shown (1 of 3)]
[im 1/1]
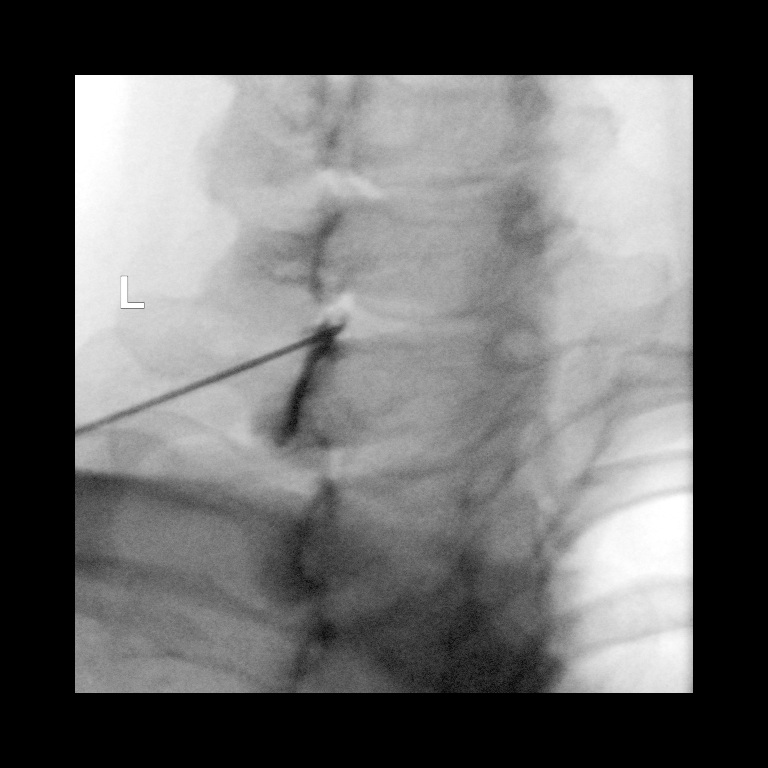

[Series 2: ortho adipose · 1 of 1 slices shown (2 of 3)]
[im 1/1]
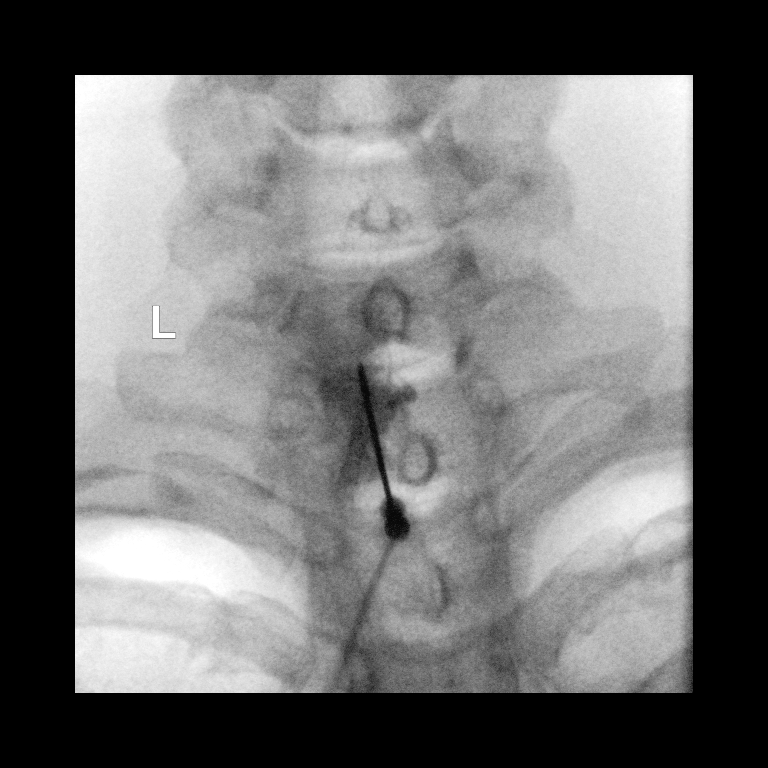

[Series 3: ortho adipose · 1 of 1 slices shown (3 of 3)]
[im 1/1]
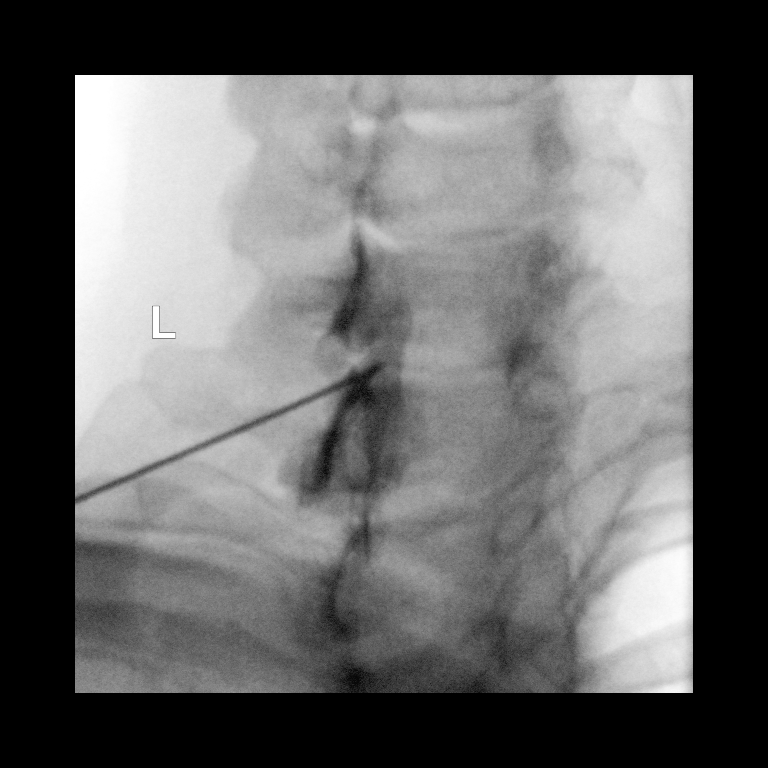

[3 of 3 positions shown; findings below may reference images not displayed]

FLUOROSCOPY TIME:  17 seconds corresponding to a Dose Area Product
of 4.77 ?Gy*m2

PROCEDURE:
Time-out was performed. Informed consent was obtained on the first
visit.

An appropriate skin entry site was chosen, cleansed with Betadine,
and anesthetized with 1% lidocaine.

CERVICAL EPIDURAL INJECTION

An interlaminar approach was performed on the LEFT at C7-T1. A 20
gauge epidural needle was advanced using loss-of-resistance
technique.

DIAGNOSTIC EPIDURAL INJECTION

Injection of Isovue-M 300 shows a good epidural pattern with spread
above and below the level of needle placement, primarily on the
LEFT. No vascular opacification is seen.

THERAPEUTIC EPIDURAL INJECTION

1.5 ml of Kenalog 40 mixed with 1 ml of 1% Lidocaine and 2 ml of
normal saline were then instilled. The procedure was well-tolerated,
and the patient was discharged thirty minutes following the
injection in good condition.
IMPRESSION: Technically successful second epidural injection on the LEFT at
C7-T1.

## 2018-03-23 DIAGNOSIS — G43719 Chronic migraine without aura, intractable, without status migrainosus: Secondary | ICD-10-CM | POA: Diagnosis not present

## 2018-03-23 DIAGNOSIS — G43019 Migraine without aura, intractable, without status migrainosus: Secondary | ICD-10-CM | POA: Diagnosis not present

## 2018-05-06 DIAGNOSIS — Z1231 Encounter for screening mammogram for malignant neoplasm of breast: Secondary | ICD-10-CM | POA: Diagnosis not present

## 2018-05-11 ENCOUNTER — Other Ambulatory Visit: Payer: Self-pay | Admitting: Family Medicine

## 2018-07-07 DIAGNOSIS — M5412 Radiculopathy, cervical region: Secondary | ICD-10-CM | POA: Diagnosis not present

## 2018-07-07 DIAGNOSIS — M4310 Spondylolisthesis, site unspecified: Secondary | ICD-10-CM | POA: Diagnosis not present

## 2018-07-07 DIAGNOSIS — M7542 Impingement syndrome of left shoulder: Secondary | ICD-10-CM | POA: Diagnosis not present

## 2018-07-07 DIAGNOSIS — M5030 Other cervical disc degeneration, unspecified cervical region: Secondary | ICD-10-CM | POA: Diagnosis not present

## 2018-07-07 DIAGNOSIS — M7532 Calcific tendinitis of left shoulder: Secondary | ICD-10-CM | POA: Diagnosis not present

## 2018-07-07 DIAGNOSIS — M7582 Other shoulder lesions, left shoulder: Secondary | ICD-10-CM | POA: Diagnosis not present

## 2018-10-06 DIAGNOSIS — G43019 Migraine without aura, intractable, without status migrainosus: Secondary | ICD-10-CM | POA: Diagnosis not present

## 2018-10-06 DIAGNOSIS — G43719 Chronic migraine without aura, intractable, without status migrainosus: Secondary | ICD-10-CM | POA: Diagnosis not present

## 2018-11-03 DIAGNOSIS — Z01419 Encounter for gynecological examination (general) (routine) without abnormal findings: Secondary | ICD-10-CM | POA: Diagnosis not present

## 2018-11-03 DIAGNOSIS — R232 Flushing: Secondary | ICD-10-CM | POA: Diagnosis not present

## 2018-11-11 DIAGNOSIS — T8332XA Displacement of intrauterine contraceptive device, initial encounter: Secondary | ICD-10-CM | POA: Diagnosis not present

## 2018-11-11 DIAGNOSIS — Z30431 Encounter for routine checking of intrauterine contraceptive device: Secondary | ICD-10-CM | POA: Diagnosis not present

## 2018-11-25 DIAGNOSIS — R928 Other abnormal and inconclusive findings on diagnostic imaging of breast: Secondary | ICD-10-CM | POA: Diagnosis not present

## 2019-01-05 DIAGNOSIS — M25512 Pain in left shoulder: Secondary | ICD-10-CM | POA: Diagnosis not present

## 2019-03-22 DIAGNOSIS — G43719 Chronic migraine without aura, intractable, without status migrainosus: Secondary | ICD-10-CM | POA: Diagnosis not present

## 2019-03-22 DIAGNOSIS — G43019 Migraine without aura, intractable, without status migrainosus: Secondary | ICD-10-CM | POA: Diagnosis not present

## 2019-04-08 DIAGNOSIS — M25511 Pain in right shoulder: Secondary | ICD-10-CM | POA: Diagnosis not present

## 2019-04-08 DIAGNOSIS — M7532 Calcific tendinitis of left shoulder: Secondary | ICD-10-CM | POA: Diagnosis not present

## 2019-04-14 DIAGNOSIS — M7532 Calcific tendinitis of left shoulder: Secondary | ICD-10-CM | POA: Diagnosis not present

## 2019-04-14 DIAGNOSIS — M7552 Bursitis of left shoulder: Secondary | ICD-10-CM | POA: Diagnosis not present

## 2019-04-28 DIAGNOSIS — M25512 Pain in left shoulder: Secondary | ICD-10-CM | POA: Diagnosis not present

## 2019-04-28 DIAGNOSIS — R29898 Other symptoms and signs involving the musculoskeletal system: Secondary | ICD-10-CM | POA: Diagnosis not present

## 2019-04-28 DIAGNOSIS — G8929 Other chronic pain: Secondary | ICD-10-CM | POA: Diagnosis not present

## 2019-04-28 DIAGNOSIS — M7532 Calcific tendinitis of left shoulder: Secondary | ICD-10-CM | POA: Diagnosis not present

## 2019-05-01 DIAGNOSIS — Z20828 Contact with and (suspected) exposure to other viral communicable diseases: Secondary | ICD-10-CM | POA: Diagnosis not present

## 2019-05-12 DIAGNOSIS — G8929 Other chronic pain: Secondary | ICD-10-CM | POA: Diagnosis not present

## 2019-05-12 DIAGNOSIS — M7532 Calcific tendinitis of left shoulder: Secondary | ICD-10-CM | POA: Diagnosis not present

## 2019-05-12 DIAGNOSIS — M25512 Pain in left shoulder: Secondary | ICD-10-CM | POA: Diagnosis not present

## 2019-05-12 DIAGNOSIS — R29898 Other symptoms and signs involving the musculoskeletal system: Secondary | ICD-10-CM | POA: Diagnosis not present

## 2019-05-26 DIAGNOSIS — G8929 Other chronic pain: Secondary | ICD-10-CM | POA: Diagnosis not present

## 2019-05-26 DIAGNOSIS — M25512 Pain in left shoulder: Secondary | ICD-10-CM | POA: Diagnosis not present

## 2019-05-26 DIAGNOSIS — R29898 Other symptoms and signs involving the musculoskeletal system: Secondary | ICD-10-CM | POA: Diagnosis not present

## 2019-05-26 DIAGNOSIS — M7532 Calcific tendinitis of left shoulder: Secondary | ICD-10-CM | POA: Diagnosis not present

## 2019-06-28 DIAGNOSIS — M7522 Bicipital tendinitis, left shoulder: Secondary | ICD-10-CM | POA: Diagnosis not present

## 2019-06-28 DIAGNOSIS — M7532 Calcific tendinitis of left shoulder: Secondary | ICD-10-CM | POA: Diagnosis not present

## 2019-06-28 DIAGNOSIS — M7542 Impingement syndrome of left shoulder: Secondary | ICD-10-CM | POA: Diagnosis not present

## 2019-07-12 DIAGNOSIS — Z20822 Contact with and (suspected) exposure to covid-19: Secondary | ICD-10-CM | POA: Diagnosis not present

## 2019-07-12 DIAGNOSIS — Z01812 Encounter for preprocedural laboratory examination: Secondary | ICD-10-CM | POA: Diagnosis not present

## 2019-07-16 DIAGNOSIS — M7542 Impingement syndrome of left shoulder: Secondary | ICD-10-CM | POA: Diagnosis not present

## 2019-07-16 DIAGNOSIS — M7532 Calcific tendinitis of left shoulder: Secondary | ICD-10-CM | POA: Diagnosis not present

## 2019-07-16 DIAGNOSIS — G8918 Other acute postprocedural pain: Secondary | ICD-10-CM | POA: Diagnosis not present

## 2019-07-16 DIAGNOSIS — M65812 Other synovitis and tenosynovitis, left shoulder: Secondary | ICD-10-CM | POA: Diagnosis not present

## 2019-07-16 DIAGNOSIS — M7522 Bicipital tendinitis, left shoulder: Secondary | ICD-10-CM | POA: Diagnosis not present

## 2019-08-23 DIAGNOSIS — M6281 Muscle weakness (generalized): Secondary | ICD-10-CM | POA: Diagnosis not present

## 2019-08-23 DIAGNOSIS — G8929 Other chronic pain: Secondary | ICD-10-CM | POA: Diagnosis not present

## 2019-08-23 DIAGNOSIS — Z9889 Other specified postprocedural states: Secondary | ICD-10-CM | POA: Diagnosis not present

## 2019-08-23 DIAGNOSIS — M25512 Pain in left shoulder: Secondary | ICD-10-CM | POA: Diagnosis not present

## 2019-09-15 DIAGNOSIS — G43019 Migraine without aura, intractable, without status migrainosus: Secondary | ICD-10-CM | POA: Diagnosis not present

## 2019-09-15 DIAGNOSIS — G43719 Chronic migraine without aura, intractable, without status migrainosus: Secondary | ICD-10-CM | POA: Diagnosis not present

## 2019-09-16 DIAGNOSIS — M62830 Muscle spasm of back: Secondary | ICD-10-CM | POA: Diagnosis not present

## 2019-09-16 DIAGNOSIS — M5413 Radiculopathy, cervicothoracic region: Secondary | ICD-10-CM | POA: Diagnosis not present

## 2019-09-16 DIAGNOSIS — M546 Pain in thoracic spine: Secondary | ICD-10-CM | POA: Diagnosis not present

## 2019-09-20 DIAGNOSIS — M5413 Radiculopathy, cervicothoracic region: Secondary | ICD-10-CM | POA: Diagnosis not present

## 2019-09-20 DIAGNOSIS — M546 Pain in thoracic spine: Secondary | ICD-10-CM | POA: Diagnosis not present

## 2019-09-20 DIAGNOSIS — M62830 Muscle spasm of back: Secondary | ICD-10-CM | POA: Diagnosis not present

## 2019-09-22 DIAGNOSIS — M5413 Radiculopathy, cervicothoracic region: Secondary | ICD-10-CM | POA: Diagnosis not present

## 2019-09-22 DIAGNOSIS — M62830 Muscle spasm of back: Secondary | ICD-10-CM | POA: Diagnosis not present

## 2019-09-22 DIAGNOSIS — M546 Pain in thoracic spine: Secondary | ICD-10-CM | POA: Diagnosis not present

## 2019-09-27 DIAGNOSIS — M546 Pain in thoracic spine: Secondary | ICD-10-CM | POA: Diagnosis not present

## 2019-09-27 DIAGNOSIS — M5413 Radiculopathy, cervicothoracic region: Secondary | ICD-10-CM | POA: Diagnosis not present

## 2019-09-27 DIAGNOSIS — M62830 Muscle spasm of back: Secondary | ICD-10-CM | POA: Diagnosis not present

## 2019-09-29 DIAGNOSIS — M546 Pain in thoracic spine: Secondary | ICD-10-CM | POA: Diagnosis not present

## 2019-09-29 DIAGNOSIS — M5413 Radiculopathy, cervicothoracic region: Secondary | ICD-10-CM | POA: Diagnosis not present

## 2019-09-29 DIAGNOSIS — M62830 Muscle spasm of back: Secondary | ICD-10-CM | POA: Diagnosis not present

## 2019-10-11 DIAGNOSIS — M5412 Radiculopathy, cervical region: Secondary | ICD-10-CM | POA: Diagnosis not present

## 2019-11-05 DIAGNOSIS — Z01419 Encounter for gynecological examination (general) (routine) without abnormal findings: Secondary | ICD-10-CM | POA: Diagnosis not present

## 2019-11-05 DIAGNOSIS — Z124 Encounter for screening for malignant neoplasm of cervix: Secondary | ICD-10-CM | POA: Diagnosis not present

## 2019-11-23 DIAGNOSIS — J309 Allergic rhinitis, unspecified: Secondary | ICD-10-CM | POA: Diagnosis not present

## 2019-11-23 DIAGNOSIS — Z20828 Contact with and (suspected) exposure to other viral communicable diseases: Secondary | ICD-10-CM | POA: Diagnosis not present

## 2020-02-01 DIAGNOSIS — Z20828 Contact with and (suspected) exposure to other viral communicable diseases: Secondary | ICD-10-CM | POA: Diagnosis not present

## 2020-02-04 DIAGNOSIS — Z20828 Contact with and (suspected) exposure to other viral communicable diseases: Secondary | ICD-10-CM | POA: Diagnosis not present

## 2020-06-26 DIAGNOSIS — G43719 Chronic migraine without aura, intractable, without status migrainosus: Secondary | ICD-10-CM | POA: Diagnosis not present

## 2020-06-26 DIAGNOSIS — G43019 Migraine without aura, intractable, without status migrainosus: Secondary | ICD-10-CM | POA: Diagnosis not present

## 2020-11-10 DIAGNOSIS — S6991XA Unspecified injury of right wrist, hand and finger(s), initial encounter: Secondary | ICD-10-CM | POA: Diagnosis not present

## 2020-12-19 DIAGNOSIS — G43019 Migraine without aura, intractable, without status migrainosus: Secondary | ICD-10-CM | POA: Diagnosis not present

## 2020-12-19 DIAGNOSIS — G43719 Chronic migraine without aura, intractable, without status migrainosus: Secondary | ICD-10-CM | POA: Diagnosis not present

## 2021-01-22 DIAGNOSIS — R051 Acute cough: Secondary | ICD-10-CM | POA: Diagnosis not present

## 2021-01-22 DIAGNOSIS — J069 Acute upper respiratory infection, unspecified: Secondary | ICD-10-CM | POA: Diagnosis not present

## 2021-02-15 DIAGNOSIS — Z01411 Encounter for gynecological examination (general) (routine) with abnormal findings: Secondary | ICD-10-CM | POA: Diagnosis not present

## 2021-02-15 DIAGNOSIS — Z13220 Encounter for screening for lipoid disorders: Secondary | ICD-10-CM | POA: Diagnosis not present

## 2021-02-15 DIAGNOSIS — Z01419 Encounter for gynecological examination (general) (routine) without abnormal findings: Secondary | ICD-10-CM | POA: Diagnosis not present

## 2021-03-23 DIAGNOSIS — Z1231 Encounter for screening mammogram for malignant neoplasm of breast: Secondary | ICD-10-CM | POA: Diagnosis not present

## 2021-04-11 DIAGNOSIS — K649 Unspecified hemorrhoids: Secondary | ICD-10-CM | POA: Diagnosis not present

## 2021-04-11 DIAGNOSIS — K5900 Constipation, unspecified: Secondary | ICD-10-CM | POA: Diagnosis not present

## 2021-05-18 DIAGNOSIS — K644 Residual hemorrhoidal skin tags: Secondary | ICD-10-CM | POA: Diagnosis not present

## 2021-05-18 DIAGNOSIS — D122 Benign neoplasm of ascending colon: Secondary | ICD-10-CM | POA: Diagnosis not present

## 2021-05-18 DIAGNOSIS — D126 Benign neoplasm of colon, unspecified: Secondary | ICD-10-CM | POA: Diagnosis not present

## 2021-05-18 DIAGNOSIS — K635 Polyp of colon: Secondary | ICD-10-CM | POA: Diagnosis not present

## 2021-05-18 DIAGNOSIS — Z1211 Encounter for screening for malignant neoplasm of colon: Secondary | ICD-10-CM | POA: Diagnosis not present

## 2021-06-18 DIAGNOSIS — G43019 Migraine without aura, intractable, without status migrainosus: Secondary | ICD-10-CM | POA: Diagnosis not present

## 2021-06-18 DIAGNOSIS — G43719 Chronic migraine without aura, intractable, without status migrainosus: Secondary | ICD-10-CM | POA: Diagnosis not present

## 2021-12-17 DIAGNOSIS — G43719 Chronic migraine without aura, intractable, without status migrainosus: Secondary | ICD-10-CM | POA: Diagnosis not present

## 2022-01-10 DIAGNOSIS — C44619 Basal cell carcinoma of skin of left upper limb, including shoulder: Secondary | ICD-10-CM | POA: Diagnosis not present

## 2022-02-19 DIAGNOSIS — Z01419 Encounter for gynecological examination (general) (routine) without abnormal findings: Secondary | ICD-10-CM | POA: Diagnosis not present

## 2022-08-12 DIAGNOSIS — G43019 Migraine without aura, intractable, without status migrainosus: Secondary | ICD-10-CM | POA: Diagnosis not present

## 2023-01-21 DIAGNOSIS — F4310 Post-traumatic stress disorder, unspecified: Secondary | ICD-10-CM | POA: Diagnosis not present

## 2023-01-27 DIAGNOSIS — Z3202 Encounter for pregnancy test, result negative: Secondary | ICD-10-CM | POA: Diagnosis not present

## 2023-01-27 DIAGNOSIS — Z30433 Encounter for removal and reinsertion of intrauterine contraceptive device: Secondary | ICD-10-CM | POA: Diagnosis not present

## 2023-01-29 DIAGNOSIS — F4310 Post-traumatic stress disorder, unspecified: Secondary | ICD-10-CM | POA: Diagnosis not present

## 2023-02-17 DIAGNOSIS — F4310 Post-traumatic stress disorder, unspecified: Secondary | ICD-10-CM | POA: Diagnosis not present

## 2023-02-18 DIAGNOSIS — N951 Menopausal and female climacteric states: Secondary | ICD-10-CM | POA: Diagnosis not present

## 2023-02-18 DIAGNOSIS — R6882 Decreased libido: Secondary | ICD-10-CM | POA: Diagnosis not present

## 2023-02-18 DIAGNOSIS — Z01419 Encounter for gynecological examination (general) (routine) without abnormal findings: Secondary | ICD-10-CM | POA: Diagnosis not present

## 2023-02-18 DIAGNOSIS — R232 Flushing: Secondary | ICD-10-CM | POA: Diagnosis not present

## 2023-03-17 DIAGNOSIS — F4310 Post-traumatic stress disorder, unspecified: Secondary | ICD-10-CM | POA: Diagnosis not present

## 2023-03-24 ENCOUNTER — Encounter: Payer: Self-pay | Admitting: Genetic Counselor

## 2023-04-03 DIAGNOSIS — F4310 Post-traumatic stress disorder, unspecified: Secondary | ICD-10-CM | POA: Diagnosis not present

## 2023-05-27 DIAGNOSIS — G43719 Chronic migraine without aura, intractable, without status migrainosus: Secondary | ICD-10-CM | POA: Diagnosis not present
